# Patient Record
Sex: Male | Born: 1995 | Race: White | Hispanic: No | Marital: Single | Smoking: Never smoker
Health system: Southern US, Community
[De-identification: ages and names within clinical notes are randomized; demographics above are authoritative.]

---
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
## 2015-04-27 ENCOUNTER — Encounter (HOSPITAL_COMMUNITY): Payer: Self-pay | Admitting: Emergency Medicine

## 2015-04-27 ENCOUNTER — Emergency Department (HOSPITAL_COMMUNITY)
Admission: EM | Admit: 2015-04-27 | Discharge: 2015-04-28 | Disposition: A | Discharge status: Home / Self Care | Payer: 59 | Attending: Emergency Medicine | Admitting: Emergency Medicine

## 2015-04-27 DIAGNOSIS — Z79899 Other long term (current) drug therapy: Secondary | ICD-10-CM | POA: Diagnosis not present

## 2015-04-27 DIAGNOSIS — R5383 Other fatigue: Secondary | ICD-10-CM | POA: Insufficient documentation

## 2015-04-27 DIAGNOSIS — M545 Low back pain: Secondary | ICD-10-CM | POA: Diagnosis present

## 2015-04-27 DIAGNOSIS — R195 Other fecal abnormalities: Secondary | ICD-10-CM | POA: Insufficient documentation

## 2015-04-27 DIAGNOSIS — K625 Hemorrhage of anus and rectum: Secondary | ICD-10-CM | POA: Diagnosis not present

## 2015-04-27 DIAGNOSIS — R51 Headache: Secondary | ICD-10-CM | POA: Diagnosis not present

## 2015-04-27 LAB — CBC
HCT: 44.6 % (ref 39.0–52.0)
HEMOGLOBIN: 15.5 g/dL (ref 13.0–17.0)
MCH: 29.4 pg (ref 26.0–34.0)
MCHC: 34.8 g/dL (ref 30.0–36.0)
MCV: 84.6 fL (ref 78.0–100.0)
Platelets: 232 10*3/uL (ref 150–400)
RBC: 5.27 MIL/uL (ref 4.22–5.81)
RDW: 12.7 % (ref 11.5–15.5)
WBC: 5.6 10*3/uL (ref 4.0–10.5)

## 2015-04-27 LAB — COMPREHENSIVE METABOLIC PANEL
ALBUMIN: 4.6 g/dL (ref 3.5–5.0)
ALK PHOS: 149 U/L — AB (ref 38–126)
ALT: 28 U/L (ref 17–63)
AST: 33 U/L (ref 15–41)
Anion gap: 8 (ref 5–15)
BILIRUBIN TOTAL: 1 mg/dL (ref 0.3–1.2)
BUN: 21 mg/dL — AB (ref 6–20)
CALCIUM: 9.1 mg/dL (ref 8.9–10.3)
CO2: 29 mmol/L (ref 22–32)
Chloride: 100 mmol/L — ABNORMAL LOW (ref 101–111)
Creatinine, Ser: 1.36 mg/dL — ABNORMAL HIGH (ref 0.61–1.24)
GFR calc Af Amer: >60 mL/min (ref >60)
GFR calc non Af Amer: >60 mL/min (ref >60)
GLUCOSE: 89 mg/dL (ref 65–99)
Potassium: 4.4 mmol/L (ref 3.5–5.1)
SODIUM: 137 mmol/L (ref 135–145)
TOTAL PROTEIN: 7.3 g/dL (ref 6.5–8.1)

## 2015-04-27 LAB — LIPASE, BLOOD: Lipase: 34 U/L (ref 11–51)

## 2015-04-27 NOTE — ED Notes (Signed)
Pt states on Tuesday he had blood in his stool [facility]  Pt states since then he has felt fatigued for no reason, feels dehydrated, bloated, and every bone in his body aches especially his back  Pt states he has abd pain and today he had a stool that was completely black in color

## 2015-04-28 LAB — URINALYSIS, ROUTINE W REFLEX MICROSCOPIC
BILIRUBIN URINE: NEGATIVE
Glucose, UA: NEGATIVE mg/dL
Ketones, ur: NEGATIVE mg/dL
LEUKOCYTES UA: NEGATIVE
Nitrite: NEGATIVE
Protein, ur: NEGATIVE mg/dL
SPECIFIC GRAVITY, URINE: 1.015 (ref 1.005–1.030)
pH: 6 (ref 5.0–8.0)

## 2015-04-28 LAB — URINE MICROSCOPIC-ADD ON

## 2015-04-28 LAB — POC OCCULT BLOOD, ED: FECAL OCCULT BLD: NEGATIVE

## 2015-04-28 MED ORDER — SODIUM CHLORIDE 0.9 % IV BOLUS (SEPSIS)
2000.0000 mL | Freq: Once | INTRAVENOUS | Status: AC
  Administered 2015-04-28: 2000 mL via INTRAVENOUS

## 2015-04-28 MED ORDER — IBUPROFEN 600 MG PO TABS: 600.0000 mg | ORAL_TABLET | Freq: Three times a day (TID) | ORAL | Status: AC | PRN

## 2015-04-28 NOTE — ED Provider Notes (Signed)
CSN: [id number]     Arrival date & time 04/27/15  1825 History  By signing my name below, I, Linus Galas, attest that this documentation has been prepared under the direction and in the presence of Azalia Bilis, MD. Electronically Signed: Linus Galas, ED Scribe. 04/28/2015. 12:28 AM.  Chief Complaint  Patient presents with  . Generalized Body Aches  . Fatigue  . Rectal Bleeding   The history is provided by the patient. No language interpreter was used.   HPI Comments: David Franklin is a 20 y.o. male brought in by mother to the Emergency Department with no PMHx complaining of blood in stools that began 2 days ago. Pt also reports feeling bloated, feverish, intermittent HA, and constant left lower back pain. Pt states he noted bright red blood when wiping. Afterwards, he noted dark "black" stools with no foul odor. Pt has had more frequent BM. Pt states that his back pain radiates to his upper legs when standing. Pt denies OTC medication for his symptoms. Pt denies any other symptoms at this time.  History reviewed. No pertinent past medical history. History reviewed. No pertinent past surgical history. Family History  Problem Relation Age of Onset  . Hypertension Father   . Diabetes Other    Social History  Substance Use Topics  . Smoking status: Never Smoker   . Smokeless tobacco: None  . Alcohol Use: No    Review of Systems A complete 10 system review of systems was obtained and all systems are negative except as noted in the HPI and PMH.   Allergies  Review of patient's allergies indicates no known allergies.  Home Medications   Prior to Admission medications   Medication Sig Start Date End Date Taking? Authorizing Provider  acidophilus (RISAQUAD) CAPS capsule Take 1 capsule by mouth daily.   Yes Historical Provider, MD  Multiple Vitamin (MULTIVITAMIN WITH MINERALS) TABS tablet Take 1 tablet by mouth daily.   Yes Historical Provider, MD  Omega-3 Fatty Acids (FISH OIL  PO) Take 1 capsule by mouth daily.   Yes Historical Provider, MD   BP 126/74 mmHg  Pulse 94  Temp(Src) 100 F (37.8 C) (Oral)  Resp 16  Ht  (1.702 m)  Wt 173 lb (78.472 kg)  BMI 27.09 kg/m2  SpO2 99%   Physical Exam  Constitutional: He is oriented to person, place, and time. He appears well-developed and well-nourished.  HENT:  Head: Normocephalic and atraumatic.  Eyes: EOM are normal.  Neck: Normal range of motion.  Cardiovascular: Normal rate, regular rhythm, normal heart sounds and intact distal pulses.   Pulmonary/Chest: Effort normal and breath sounds normal. No respiratory distress.  Abdominal: Soft. He exhibits no distension. There is no tenderness.  Genitourinary:  No gross blood per nursing. Dark stool  Musculoskeletal: Normal range of motion.  Neurological: He is alert and oriented to person, place, and time.  Skin: Skin is warm and dry.  Psychiatric: He has a normal mood and affect. Judgment normal.  Nursing note and vitals reviewed.   ED Course  Procedures   DIAGNOSTIC STUDIES: Oxygen Saturation is 99% on room air, normal by my interpretation.    COORDINATION OF CARE: 12:20 AM Will give fluids. Will order POC occult blood. Discussed treatment plan with pt and mother at bedside and they agreed to plan.  Labs Review Labs Reviewed  COMPREHENSIVE METABOLIC PANEL - Abnormal; Notable for the following:    Chloride 100 (*)    BUN 21 (*)  Creatinine, Ser 1.36 (*)    Alkaline Phosphatase 149 (*)    All other components within normal limits  URINALYSIS, ROUTINE W REFLEX MICROSCOPIC (NOT [facility]) - Abnormal; Notable for the following:    APPearance CLOUDY (*)    Hgb urine dipstick TRACE (*)    All other components within normal limits  URINE MICROSCOPIC-ADD ON - Abnormal; Notable for the following:    Squamous Epithelial / LPF 0-5 (*)    Bacteria, UA RARE (*)    All other components within normal limits  LIPASE, BLOOD  CBC  POC OCCULT BLOOD, ED   MDM    Final diagnoses:  Low back pain without sciatica, unspecified back pain laterality  Stool color abnormal    Hemoccult negative for blood.  Nonspecific low back pain likely musculoskeletal.  Low-grade fever.  Hemoglobin 15.  Overall well-appearing.  Denyse Dagomer the torn emergency department.  No indication for advanced imaging.  No abdominal tenderness on examination.  I personally performed the services described in this documentation, which was scribed in my presence. The recorded information has been reviewed and is accurate.       Azalia BilisKevin Abelina Ketron, MD 04/28/15 [phone]

## 2017-04-04 ENCOUNTER — Ambulatory Visit
Admission: RE | Admit: 2017-04-04 | Discharge: 2017-04-04 | Disposition: A | Discharge status: Home / Self Care | Payer: 59 | Source: Ambulatory Visit | Attending: Family Medicine | Admitting: Family Medicine

## 2017-04-04 ENCOUNTER — Other Ambulatory Visit: Payer: Self-pay | Admitting: Family Medicine

## 2017-04-04 DIAGNOSIS — M5442 Lumbago with sciatica, left side: Secondary | ICD-10-CM

## 2018-11-23 IMAGING — CR DG LUMBAR SPINE COMPLETE 4+V
5 series · 5 of 5 positions shown · non-contrast
Comparison: None.

CLINICAL DATA: Acute back pain with sciatica

EXAM:
LUMBAR SPINE - COMPLETE 4+ VIEW

[w lumbar spine ap]
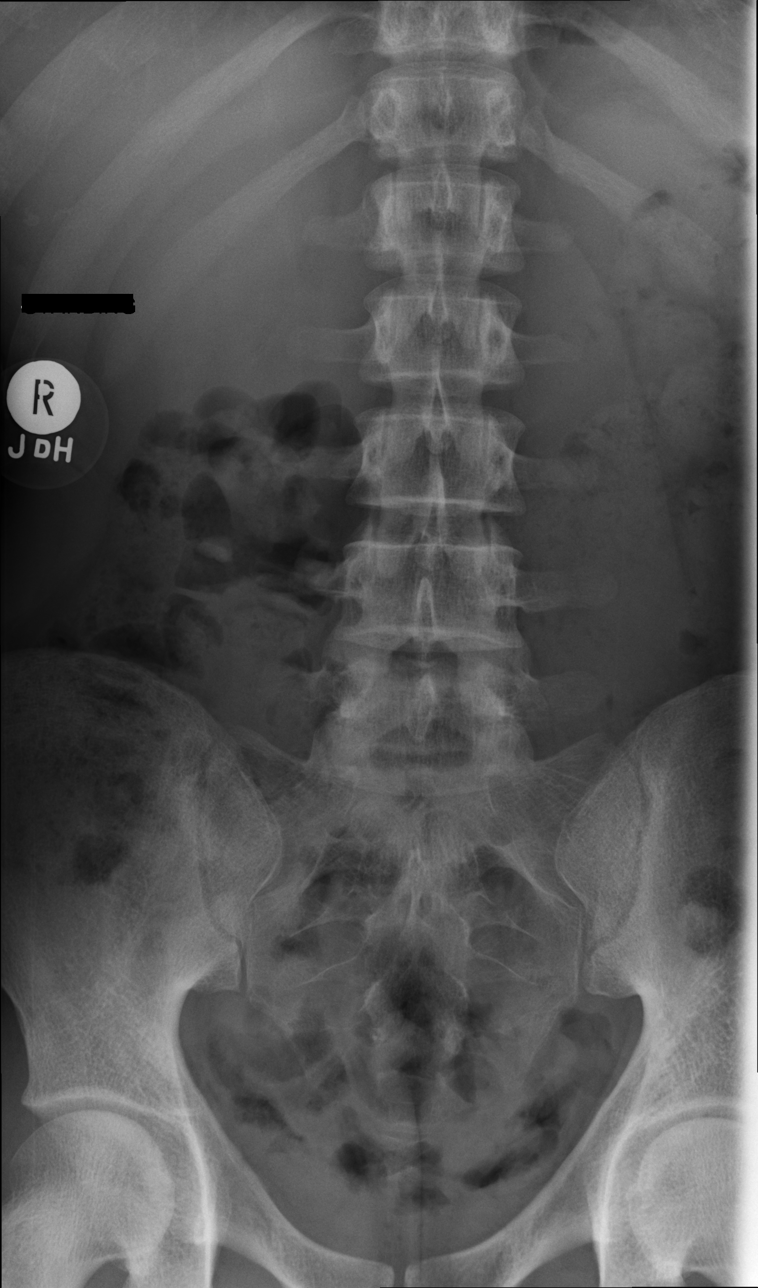

[w lumbar spine obl (1 of 2)]
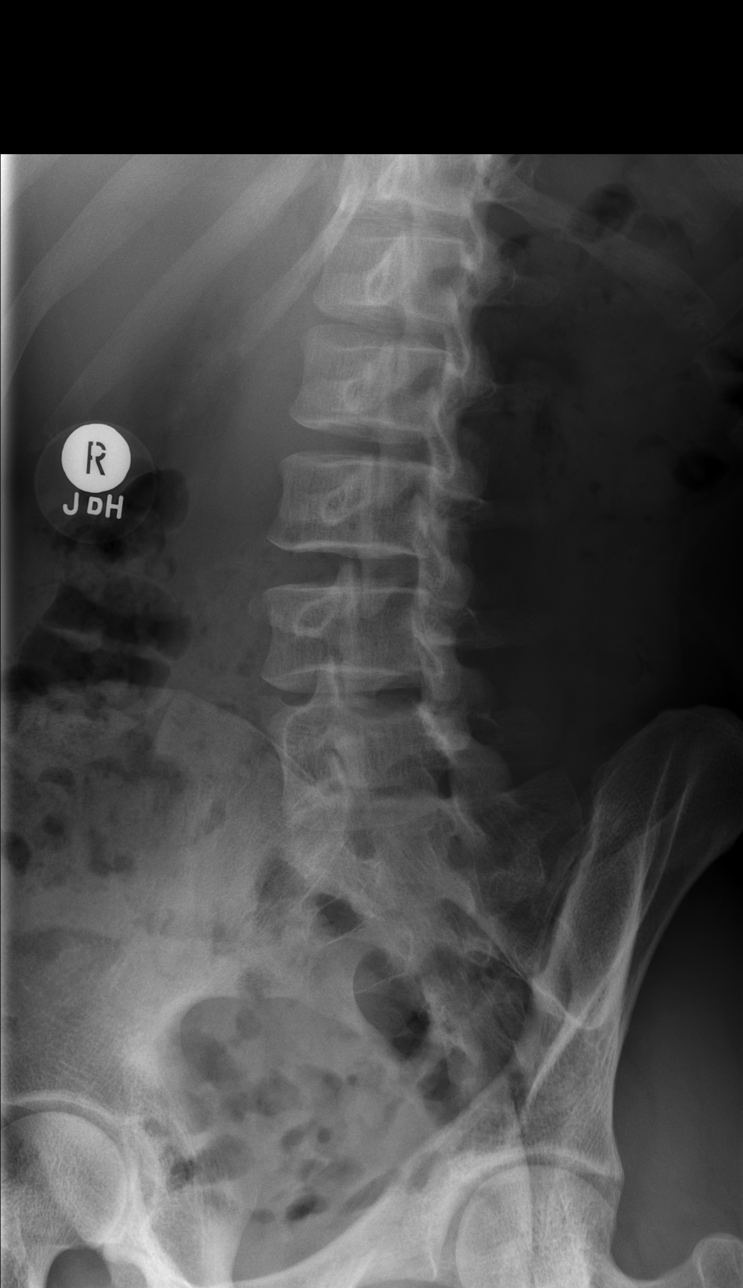

[w lumbar spine obl (2 of 2)]
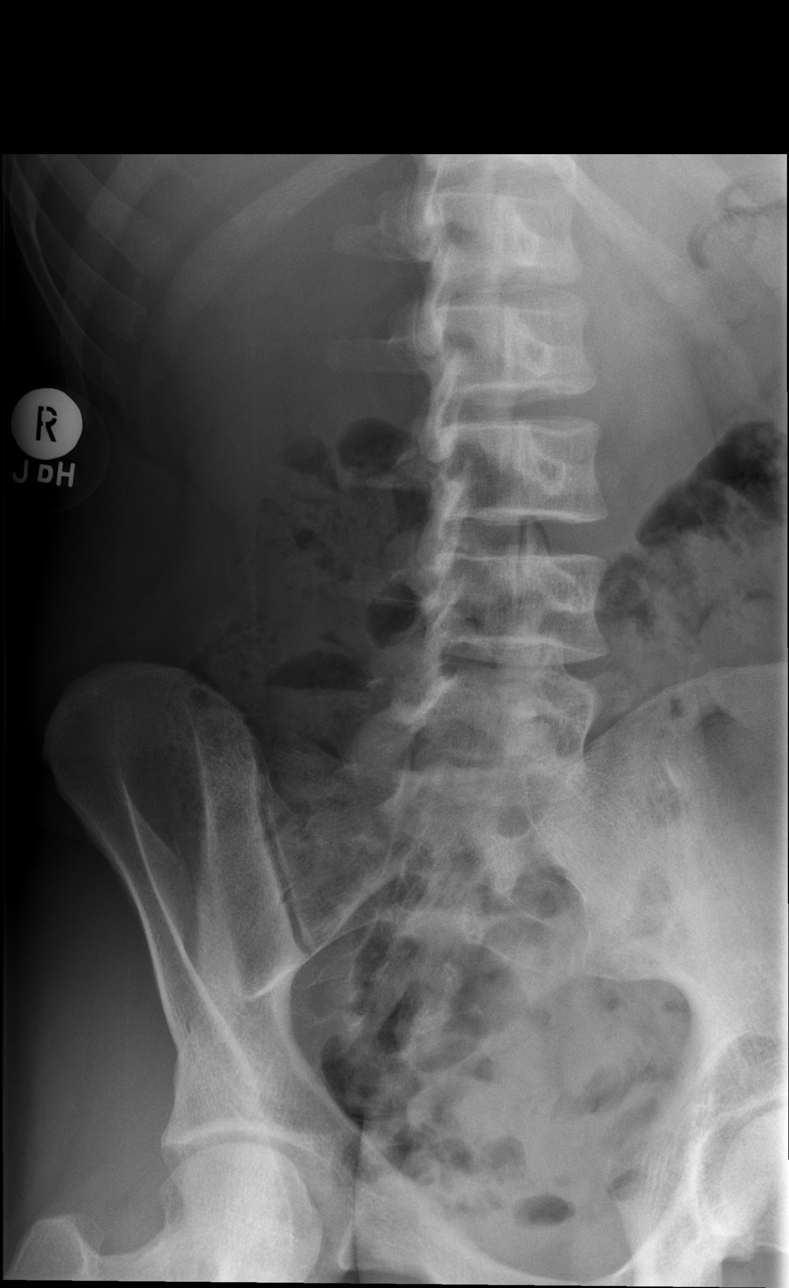

[w lumbar spine lat]
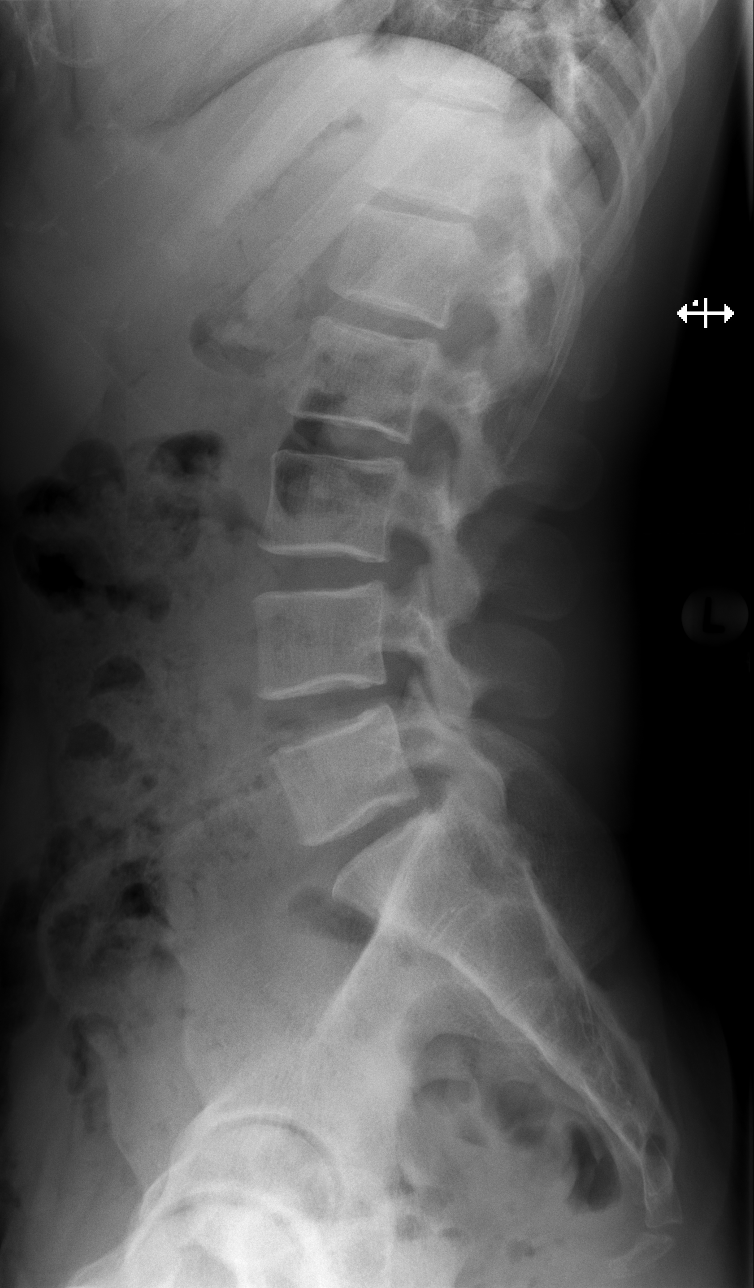

[w lumbar l-5 s-1 spot]
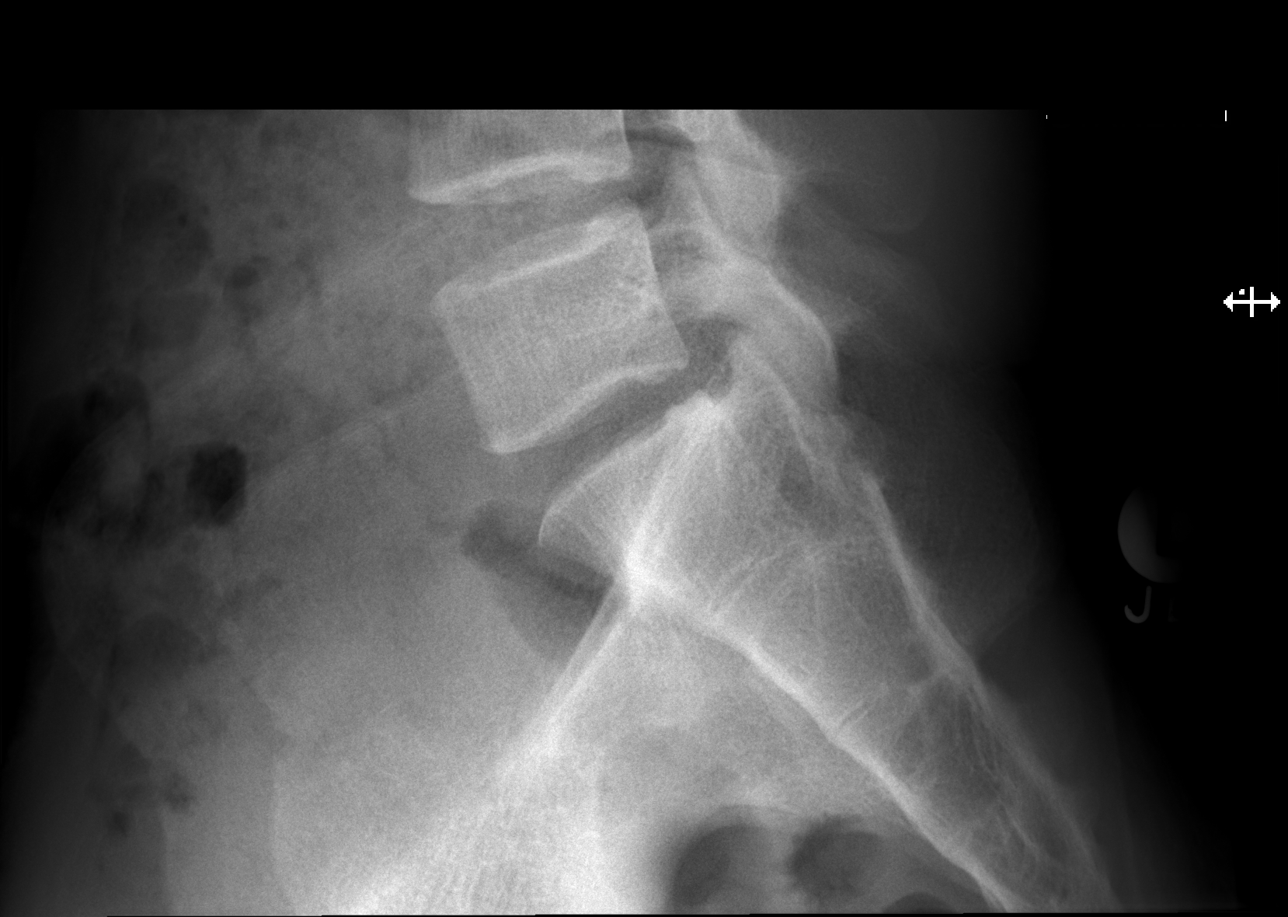

[5 of 5 positions shown; findings below may reference images not displayed]

FINDINGS: There is no evidence of lumbar spine fracture. Alignment is normal.
Intervertebral disc spaces are maintained.
IMPRESSION: Negative.
